# Patient Record
Sex: Male | Born: 1990 | Race: White | Hispanic: No | Marital: Single | Smoking: Current every day smoker
Health system: Southern US, Community
[De-identification: ages and names within clinical notes are randomized; demographics above are authoritative.]

## PROBLEM LIST (undated history)

## (undated) HISTORY — PX: FEMUR SURGERY: SHX943

---

## 2017-06-30 ENCOUNTER — Emergency Department
Admission: EM | Admit: 2017-06-30 | Discharge: 2017-07-01 | Disposition: A | Discharge status: Home / Self Care | Payer: Worker's Compensation | Attending: Emergency Medicine | Admitting: Emergency Medicine

## 2017-06-30 ENCOUNTER — Encounter: Payer: Self-pay | Admitting: Emergency Medicine

## 2017-06-30 ENCOUNTER — Emergency Department: Payer: Worker's Compensation

## 2017-06-30 DIAGNOSIS — Y990 Civilian activity done for income or pay: Secondary | ICD-10-CM | POA: Diagnosis not present

## 2017-06-30 DIAGNOSIS — S8992XA Unspecified injury of left lower leg, initial encounter: Secondary | ICD-10-CM | POA: Diagnosis present

## 2017-06-30 DIAGNOSIS — Y929 Unspecified place or not applicable: Secondary | ICD-10-CM | POA: Insufficient documentation

## 2017-06-30 DIAGNOSIS — S838X2A Sprain of other specified parts of left knee, initial encounter: Secondary | ICD-10-CM

## 2017-06-30 DIAGNOSIS — F17200 Nicotine dependence, unspecified, uncomplicated: Secondary | ICD-10-CM | POA: Insufficient documentation

## 2017-06-30 DIAGNOSIS — M25561 Pain in right knee: Secondary | ICD-10-CM

## 2017-06-30 DIAGNOSIS — S8392XA Sprain of unspecified site of left knee, initial encounter: Secondary | ICD-10-CM | POA: Diagnosis not present

## 2017-06-30 DIAGNOSIS — Y939 Activity, unspecified: Secondary | ICD-10-CM | POA: Diagnosis not present

## 2017-06-30 DIAGNOSIS — X501XXA Overexertion from prolonged static or awkward postures, initial encounter: Secondary | ICD-10-CM | POA: Insufficient documentation

## 2017-06-30 MED ORDER — IBUPROFEN 800 MG PO TABS: 800.0000 mg | ORAL_TABLET | Freq: Three times a day (TID) | ORAL | 0 refills | Status: AC | PRN

## 2017-06-30 MED ORDER — IBUPROFEN 800 MG PO TABS
800.0000 mg | ORAL_TABLET | Freq: Once | ORAL | Status: AC
  Administered 2017-06-30: 800 mg via ORAL
  Filled 2017-06-30: qty 1

## 2017-06-30 NOTE — ED Triage Notes (Signed)
Pt reports was at work and tripped on something, pt reports he did not fall reports he twisted left knee unable to extend knee. Pt denies any other symptom

## 2017-06-30 NOTE — Discharge Instructions (Signed)
Maintain nonweightbearing precautions while ambulating with crutches until you follow-up with orthopedics.  Continue ibuprofen for pain and inflammation as needed.

## 2017-06-30 NOTE — ED Notes (Signed)
Sherilyn CooterHenry RN called the Pt's supervisor Lanette Hampshireavid Streter [phone]). The Pt's supervisor requested a urine drug screen; nothing else.
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
## 2024-05-04 DIAGNOSIS — H748X2 Other specified disorders of left middle ear and mastoid: Secondary | ICD-10-CM | POA: Diagnosis not present

## 2024-05-04 DIAGNOSIS — M7989 Other specified soft tissue disorders: Secondary | ICD-10-CM | POA: Diagnosis not present

## 2024-05-04 DIAGNOSIS — H6092 Unspecified otitis externa, left ear: Secondary | ICD-10-CM | POA: Diagnosis not present
